# Patient Record
Sex: Female | Born: 1970 | Race: Black or African American | Hispanic: No | Marital: Single
Health system: Southern US, Community
[De-identification: ages and names within clinical notes are randomized; demographics above are authoritative.]

---

## 2016-01-30 ENCOUNTER — Emergency Department (HOSPITAL_COMMUNITY)
Admission: EM | Admit: 2016-01-30 | Discharge: 2016-01-30 | Disposition: A | Discharge status: Home / Self Care | Payer: BLUE CROSS/BLUE SHIELD | Attending: Emergency Medicine | Admitting: Emergency Medicine

## 2016-01-30 ENCOUNTER — Emergency Department (HOSPITAL_COMMUNITY): Payer: BLUE CROSS/BLUE SHIELD

## 2016-01-30 DIAGNOSIS — R05 Cough: Secondary | ICD-10-CM | POA: Diagnosis present

## 2016-01-30 DIAGNOSIS — J069 Acute upper respiratory infection, unspecified: Secondary | ICD-10-CM | POA: Diagnosis not present

## 2016-01-30 LAB — RAPID STREP SCREEN (MED CTR MEBANE ONLY): Streptococcus, Group A Screen (Direct): NEGATIVE

## 2016-01-30 MED ORDER — BENZONATATE 100 MG PO CAPS: 100.0000 mg | ORAL_CAPSULE | Freq: Three times a day (TID) | ORAL | 0 refills | Status: AC | PRN

## 2016-01-30 MED ORDER — IBUPROFEN 200 MG PO TABS
600.0000 mg | ORAL_TABLET | Freq: Once | ORAL | Status: AC
  Administered 2016-01-30: 600 mg via ORAL
  Filled 2016-01-30: qty 3

## 2016-01-30 MED ORDER — ACETAMINOPHEN 325 MG PO TABS
650.0000 mg | ORAL_TABLET | Freq: Once | ORAL | Status: AC
  Administered 2016-01-30: 650 mg via ORAL
  Filled 2016-01-30: qty 2

## 2016-01-30 NOTE — ED Notes (Signed)
Pt states to Dr Criss AlvineGoldston that her symptoms were flulike, she had general body aches but no sore throat.  Pt has no wheezing and she's passing air in all fields,  Dr Criss AlvineGoldston in assessing pt

## 2016-01-30 NOTE — ED Notes (Signed)
Pt jumped out of triage chair spit on floor and screamed, "My throat is closing, you bitches ain't helping me."  This writer called Dr. Criss AlvineGoldston to Triage to assess.  Pt speaking in full sentences, lungs clear b/l, no stridor, no indicators of impending airway obstruction.  As this Clinical research associatewriter was assessing lung sounds pt screamed, "I don't want these bitches touching me, I'm going to die."  Charge nurse to triage as well and is aware of situation.

## 2016-01-30 NOTE — ED Provider Notes (Signed)
[facility]-EMERGENCY DEPT Provider Note   CSN: [id number] Arrival date & time: 01/30/16  0330  By signing my name below, I, Linna Darner, attest that this documentation has been prepared under the direction and in the presence of physician practitioner, Pricilla Loveless, MD. Electronically Signed: Linna Darner, Scribe. 01/30/2016. 3:43 AM.  History   Chief Complaint No chief complaint on file.   The history is provided by the patient. No language interpreter was used.     HPI Comments: Jacqueline Sampson is a 46 y.o. female who presents to the Emergency Department complaining of her throat "closing up" intermittently beginning around 230 AM this morning. Pt reports she woke up around 230 AM d/t a cough and felt like her throat was closing. Pt specifically states that her throat does not feel swollen. She states the sensation of closing was worse with coughing and ambulating. She states she was short of breath and felt like she was "going to die" PTA. Pt states her throat is currently sore. Pt reports her throat does not feel like it is "closing" right now and the episodes last for about 30 seconds. She states she has felt very anxious in association with her throat closing. Pt notes some chills, headache, BLE pain, and a cough that is occasionally productive over the last couple of days. No new medications or foods. She denies any other complaints at this time.  No past medical history on file.  There are no active problems to display for this patient.   No past surgical history on file.  OB History    No data available       Home Medications    Prior to Admission medications   Not on File    Family History No family history on file.  Social History Social History  Substance Use Topics  . Smoking status: Not on file  . Smokeless tobacco: Not on file  . Alcohol use Not on file     Allergies   Patient has no allergy information on record.   Review of Systems Review of  Systems  Constitutional: Positive for chills.  HENT: Positive for sore throat.   Respiratory: Positive for cough and shortness of breath.   Musculoskeletal: Positive for myalgias.  Neurological: Positive for headaches.  Psychiatric/Behavioral: The patient is nervous/anxious.   All other systems reviewed and are negative.    Physical Exam Updated Vital Signs BP (!) 169/122 (BP Location: Left Arm)   Pulse (!) 136   Temp 100.6 F (38.1 C) (Oral)   Resp 25   LMP 01/29/2016   SpO2 100%   Physical Exam  Constitutional: She is oriented to person, place, and time. She appears well-developed and well-nourished.  HENT:  Head: Normocephalic and atraumatic.  Right Ear: External ear normal.  Left Ear: External ear normal.  Nose: Nose normal.  Mouth/Throat: Oropharynx is clear and moist. No oropharyngeal exudate.  Clear oropharynx with no tongue or lip swelling.  Eyes: Right eye exhibits no discharge. Left eye exhibits no discharge.  Neck: Neck supple.  Cardiovascular: Regular rhythm and normal heart sounds.  Tachycardia present.   Pulmonary/Chest: Effort normal and breath sounds normal. No stridor. She has no wheezes.  Abdominal: Soft. There is no tenderness.  Neurological: She is alert and oriented to person, place, and time.  Skin: Skin is warm and dry.  Psychiatric: Her mood appears anxious.  Nursing note and vitals reviewed.    ED Treatments / Results  Labs (all labs ordered are listed, but  only abnormal results are displayed) Labs Reviewed  RAPID STREP SCREEN (NOT AT [facility])  CULTURE, GROUP A STREP [facility])    EKG  EKG Interpretation None       Radiology Dg Chest 2 View  Result Date: 01/30/2016 CLINICAL DATA:  46 year old female with cough. EXAM: CHEST  2 VIEW COMPARISON:  None. FINDINGS: The heart size and mediastinal contours are within normal limits. Both lungs are clear. The visualized skeletal structures are unremarkable. IMPRESSION: No active cardiopulmonary  disease. Electronically Signed   By: Elgie CollardArash  Radparvar M.D.   On: 01/30/2016 04:48    Procedures Procedures (including critical care time)  DIAGNOSTIC STUDIES: Oxygen Saturation is 100% on RA, normal by my interpretation.    COORDINATION OF CARE: 3:49 AM Discussed treatment plan with pt at bedside and pt agreed to plan.  Medications Ordered in ED Medications  acetaminophen (TYLENOL) tablet 650 mg (not administered)     Initial Impression / Assessment and Plan / ED Course  I have reviewed the triage vital signs and the nursing notes.  Pertinent labs & imaging results that were available during my care of the patient were reviewed by me and considered in my medical decision making (see chart for details).     I was brought to the patient's room due to feeling like her throat was closing. On my arrival she appeared anxious and tachycardic which I think is anxiety driven. She is speaking loudly with clear voice, no stridor, no wheezes. O2 sat normal. She was verbally calmed and reassured. Exam shows no tonsillar swelling or erythema. Thus I don't think steroids would be beneficial. She has had viral URI symptoms for 2-3 days. Was watched in ED for a couple hours with no recurrent symptoms. Unclear why she had this feeling but no recent new allergens or signs of anaphylaxis or edema. I do not think she has airway pathology or emergency. Possibly flu but no indication for tamiflu. HR improved. CXR benign. Will treat symptomatically with supportive care. Advised to f/u with PCP and discussed strict return precautions.   Final Clinical Impressions(s) / ED Diagnoses   Final diagnoses:  Upper respiratory tract infection, unspecified type    New Prescriptions New Prescriptions   No medications on file   I personally performed the services described in this documentation, which was scribed in my presence. The recorded information has been reviewed and is accurate.    Pricilla LovelessScott Khaliel Morey,  MD 01/30/16 1538

## 2016-01-30 NOTE — ED Notes (Signed)
Pt requested ice two times, informed patient both times that she cannot have ice if she feels like her throat is closing.

## 2016-01-30 NOTE — ED Notes (Signed)
Began taking vitals on the patient when the patient began to cough and started to scream that her throat was closing. I asked the triage nurse to step into the room when the patient proceeded to jump out of the triage chair, jump around the room, spit on the floor while screaming. Pt stated, "my throat is closing and you bitches ain't doing nothing." Pt was speaking in full sentences and informed her that we cannot help her while she is jumping around the room. MD called to room to assess the patient. Charge nurse aware. Pt stated, "I don't want these bitches touching me," and the patient requested that this writer or triage nurse not reenter the room.

## 2016-02-01 LAB — CULTURE, GROUP A STREP (THRC)

## 2017-12-20 IMAGING — CR DG CHEST 2V
2 series · 2 of 2 positions shown · non-contrast
Comparison: None.

CLINICAL DATA: 46-year-old female with cough.

EXAM:
CHEST  2 VIEW

[w chest pa]
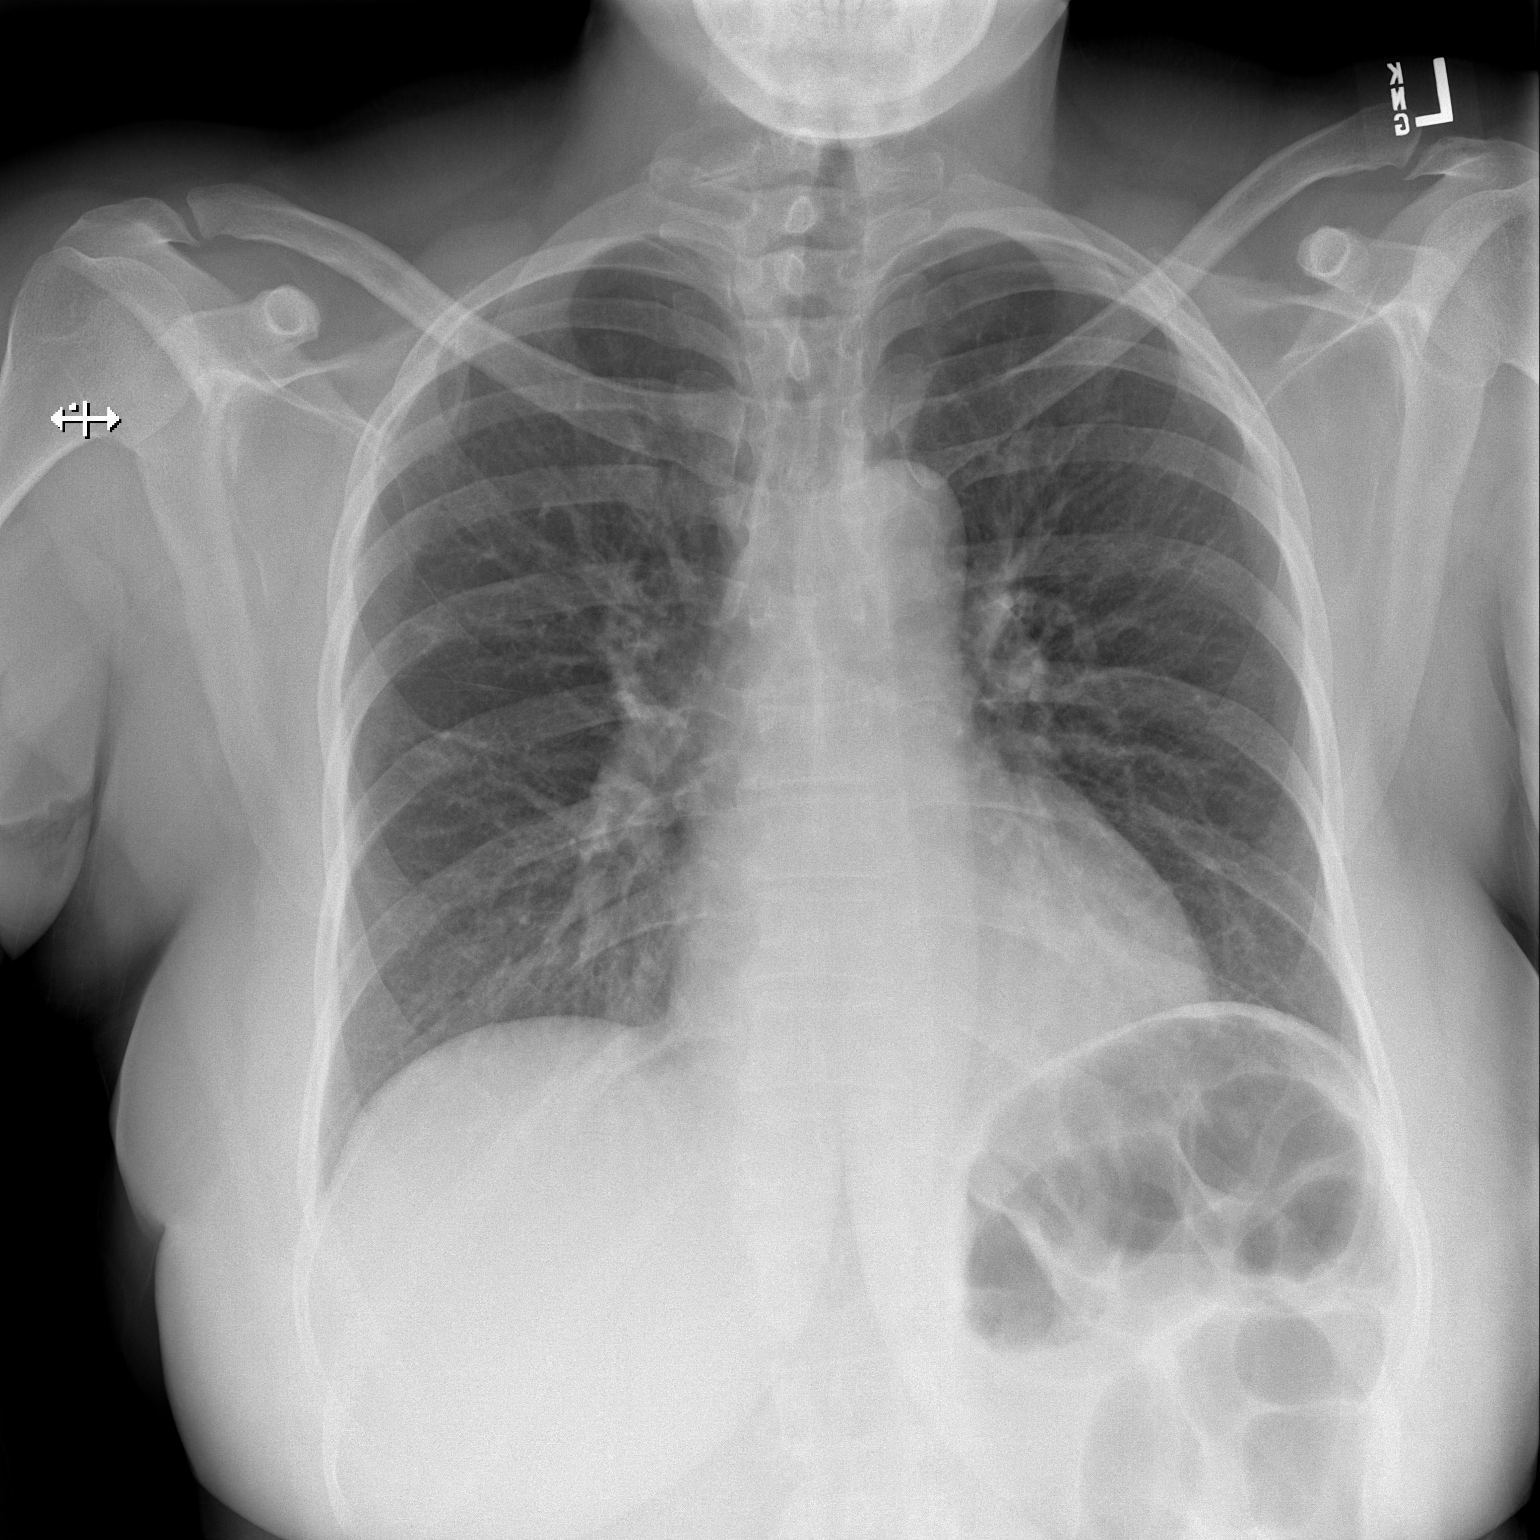

[w chest lat]
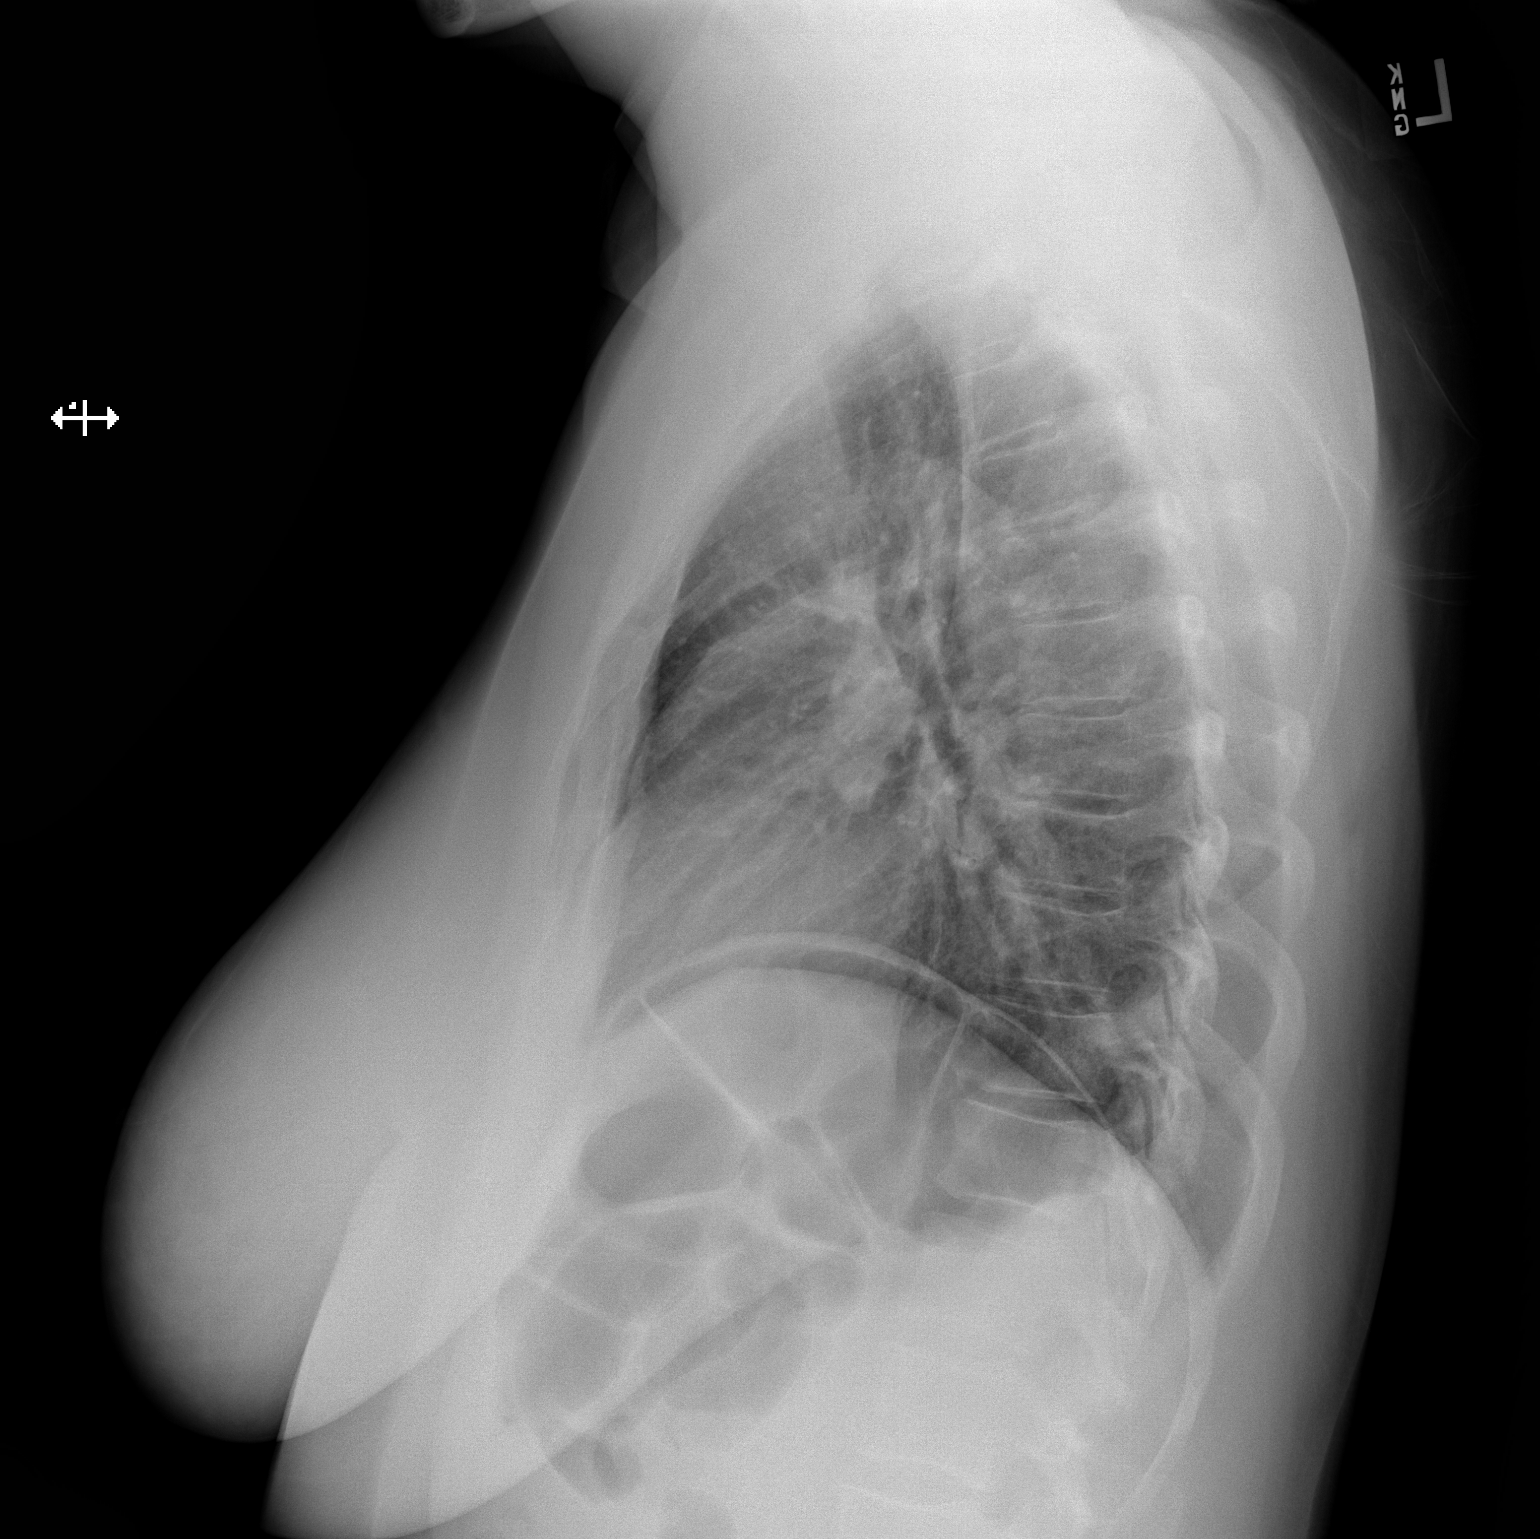

[2 of 2 positions shown; findings below may reference images not displayed]

FINDINGS: The heart size and mediastinal contours are within normal limits.
Both lungs are clear. The visualized skeletal structures are
unremarkable.
IMPRESSION: No active cardiopulmonary disease.
